# Patient Record
Sex: Male | Born: 1975 | Race: White | Hispanic: No | Marital: Married | Smoking: Never smoker
Health system: Southern US, Community
[De-identification: ages and names within clinical notes are randomized; demographics above are authoritative.]

## PROBLEM LIST (undated history)

## (undated) HISTORY — PX: COLONOSCOPY W/ POLYPECTOMY: SHX1380

---
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
## 2003-08-22 ENCOUNTER — Ambulatory Visit (HOSPITAL_COMMUNITY): Admission: RE | Admit: 2003-08-22 | Discharge: 2003-08-22 | Payer: Self-pay | Admitting: Family Medicine

## 2005-09-15 ENCOUNTER — Ambulatory Visit (HOSPITAL_COMMUNITY): Admission: RE | Admit: 2005-09-15 | Discharge: 2005-09-15 | Payer: Self-pay | Admitting: Internal Medicine

## 2013-06-30 ENCOUNTER — Other Ambulatory Visit (HOSPITAL_COMMUNITY): Payer: Self-pay | Admitting: Orthopaedic Surgery

## 2013-06-30 DIAGNOSIS — M25511 Pain in right shoulder: Secondary | ICD-10-CM

## 2013-07-05 ENCOUNTER — Encounter (HOSPITAL_COMMUNITY): Payer: Self-pay

## 2013-07-05 ENCOUNTER — Other Ambulatory Visit (HOSPITAL_COMMUNITY): Payer: Self-pay | Admitting: Orthopaedic Surgery

## 2013-07-05 ENCOUNTER — Ambulatory Visit (HOSPITAL_COMMUNITY)
Admission: RE | Admit: 2013-07-05 | Discharge: 2013-07-05 | Disposition: A | Discharge status: Home / Self Care | Payer: 59 | Source: Ambulatory Visit | Attending: Orthopaedic Surgery | Admitting: Orthopaedic Surgery

## 2013-07-05 DIAGNOSIS — S43439A Superior glenoid labrum lesion of unspecified shoulder, initial encounter: Secondary | ICD-10-CM | POA: Insufficient documentation

## 2013-07-05 DIAGNOSIS — M25511 Pain in right shoulder: Secondary | ICD-10-CM

## 2013-07-05 DIAGNOSIS — X58XXXA Exposure to other specified factors, initial encounter: Secondary | ICD-10-CM | POA: Insufficient documentation

## 2013-07-05 DIAGNOSIS — M25519 Pain in unspecified shoulder: Secondary | ICD-10-CM | POA: Insufficient documentation

## 2013-07-05 MED ORDER — LIDOCAINE HCL (PF) 1 % IJ SOLN
INTRAMUSCULAR | Status: AC
  Filled 2013-07-05: qty 5

## 2013-07-05 MED ORDER — IOHEXOL 300 MG/ML  SOLN
50.0000 mL | Freq: Once | INTRAMUSCULAR | Status: AC | PRN
  Administered 2013-07-05: 50 mL via INTRAVENOUS

## 2013-07-27 ENCOUNTER — Ambulatory Visit (HOSPITAL_COMMUNITY)
Admission: RE | Admit: 2013-07-27 | Discharge: 2013-07-27 | Disposition: A | Discharge status: Home / Self Care | Payer: 59 | Source: Ambulatory Visit | Attending: Orthopaedic Surgery | Admitting: Orthopaedic Surgery

## 2013-07-27 DIAGNOSIS — R29898 Other symptoms and signs involving the musculoskeletal system: Secondary | ICD-10-CM | POA: Insufficient documentation

## 2013-07-27 DIAGNOSIS — M25511 Pain in right shoulder: Secondary | ICD-10-CM

## 2013-07-27 DIAGNOSIS — M25519 Pain in unspecified shoulder: Secondary | ICD-10-CM | POA: Insufficient documentation

## 2013-07-27 DIAGNOSIS — IMO0001 Reserved for inherently not codable concepts without codable children: Secondary | ICD-10-CM | POA: Insufficient documentation

## 2013-07-27 DIAGNOSIS — M25619 Stiffness of unspecified shoulder, not elsewhere classified: Secondary | ICD-10-CM | POA: Insufficient documentation

## 2013-07-27 DIAGNOSIS — S43431A Superior glenoid labrum lesion of right shoulder, initial encounter: Secondary | ICD-10-CM | POA: Insufficient documentation

## 2013-07-27 NOTE — Evaluation (Signed)
Physical Therapy Evaluation  Patient Details  Name: Geoffrey Hardy MRN: [id number] Date of Birth: Nov 16, 1975  Today's Date: 07/27/2013 Time: 1535-1600 PT Time Calculation (min): 25 min Charges: 1evaluation             Visit#: 1 of 9  Re-eval: 08/17/13 Assessment Diagnosis: Rt shoulder  Next MD Visit: Dr. Ophelia Charter - Oct 30th  Prior Therapy: None  Subjective Symptoms/Limitations Symptoms: Pt is a 25 year Rt handed old male referred to PT for Rt shoulder pain which happened after demonstrating a head first slide into the base when demonstrating to his 39 year old daugthers softball team.  He reports that he did not have any pain initally, the next day he had significant pain to his RUE.  Unable to throw a ball.  MRI which revealed a laral tear and RTC tear.  Mostly concerned about the Labral tear.  Limitations: Lifting;House hold activities;Other (comment) (thorwing a softball, grooming) Patient Stated Goals: avoid surgery if possible.  Pain Assessment Currently in Pain?: Yes Pain Score: 8  (with 90/90 shoulder motion) Pain Location: Shoulder Pain Orientation: Right Pain Onset: 1 to 4 weeks ago Pain Frequency: Intermittent Effect of Pain on Daily Activities: unable to comfortably brush hair, difficulty with a coat.   Precautions/Restrictions     Balance Screening Balance Screen Has the patient fallen in the past 6 months: Yes How many times?: 1 Has the patient had a decrease in activity level because of a fear of falling? : Yes Is the patient reluctant to leave their home because of a fear of falling? 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, hunting and Scientist, forensic with leash training  Cognition/Observation    Sensation/Coordination/Flexibility/Functional Tests Functional Tests Functional Tests: FOTO: status: 61%; Limiation: 39%  Assessment RUE AROM  (degrees) Right Shoulder Flexion: 150 Degrees Right Shoulder ABduction: 110 Degrees Right Shoulder Internal Rotation: 90 Degrees Right Shoulder External Rotation: 60 Degrees RUE PROM (degrees) Right Shoulder Flexion: 160 Degrees Right Shoulder ABduction: 150 Degrees Right Shoulder Internal Rotation: 90 Degrees Right Shoulder External Rotation: 75 Degrees RUE Strength Right Shoulder Flexion: 3+/5 Right Shoulder Extension: 5/5 Right Shoulder ABduction: 3+/5 Right Shoulder Internal Rotation: 5/5 Right Shoulder External Rotation: 5/5 Right Shoulder Horizontal ABduction: 4/5 Right Shoulder Horizontal ADduction: 4/5 Palpation Palpation: pain and tenderness with moderate fascial restricitos to Rt UT and and deltoid tuberosity  Physical Therapy Assessment and Plan PT Assessment and Plan Clinical Impression Statement: Pt is a 37 year old male referred to PT for Rt shoulder SLAP lesion w/RTC tear with impairments listed below.  At this time pt is attempting therapy to decrease his need for surgery.  After evaluation (pt is 25 min late for) it is found that he is most limited by his lack of AROM shoulder abduction with ER due to SLAP  lesion and RTC tear.  with improved PROM.  Pt will benefit from skilled therapeutic intervention in order to improve on the following deficits: Pain;Decreased strength;Decreased range of motion;Impaired UE functional use Rehab Potential: Good PT Frequency: Min 3X/week PT Duration:  (3 weeks) PT Treatment/Interventions: Therapeutic activities;Therapeutic exercise;Balance training;Functional mobility training;Patient/family education;Manual techniques PT Plan: PROM for Rt shoulder motion in all direction, keep pain low.  Theraball activities and dowel rod for AAROM activities. progress to prone activties and therband activities to improve functional strength and motion.     Goals Home Exercise Program Pt/caregiver will Perform Home  Exercise Program:  Independently PT Goal: Perform Home Exercise Program - Progress: Goal set today PT Short Term Goals Time to Complete Short Term Goals: 3 weeks PT Short Term Goal 1: Pt will report pain with 90/90 shoulder motion less than a 4/10.  PT Short Term Goal 2: Pt will present improved Rt shoulder AROM: flexion: 165, scaption/abduction: 150, ER: 85 for improved RUE function with grooming.  PT Short Term Goal 3: Pt will improve his RUE shoulder strength at end range of motion in order to return to throwing a softball for 50 feet to return to participating with his daughters athletics.   Problem List Patient Active Problem List   Diagnosis Date Noted  . Right shoulder pain 07/27/2013  . 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 Right arm weakness 07/27/2013    PT Plan of Care PT Home Exercise Plan: given PT Patient Instructions: importance of HEP and strengthenig.   GP    Ysenia Filice, MPT, ATC 07/27/2013, 5:09 PM  Physician Documentation Your signature is required to indicate approval of the treatment plan as stated above.  Please sign and either send electronically or make a copy of this report for your files and return this physician signed original.   Please mark one 1.__approve of plan  2. ___approve of plan with the following conditions.   ______________________________                                                          _____________________ Physician Signature                                                                                                             Date

## 2013-08-08 ENCOUNTER — Ambulatory Visit (HOSPITAL_COMMUNITY): Payer: 59 | Admitting: Physical Therapy

## 2013-08-10 ENCOUNTER — Ambulatory Visit (HOSPITAL_COMMUNITY)
Admission: RE | Admit: 2013-08-10 | Discharge: 2013-08-10 | Disposition: A | Discharge status: Home / Self Care | Payer: 59 | Source: Ambulatory Visit | Attending: Internal Medicine | Admitting: Internal Medicine

## 2013-08-10 NOTE — Progress Notes (Addendum)
Physical Therapy Treatment Patient Details  Name: Geoffrey Hardy MRN: [id number] Date of Birth: 1976/04/22  Today's Date: 08/10/2013 Time: 1522-1610 PT Time Calculation (min): 48 min Visit#: 2 of 9  Re-eval: 08/17/13 Charges:  therex 35' ice 10''  Subjective: Pt states he was sore after last visit and had difficulty sleeping that night.  Currently without pain unless he places Rt UE into end ROM.  Exercise/Treatments Supine Flexion: 10 reps;AAROM;Other (comment);Limitations Flexion Limitations: w/2# dowel Other Supine Exercises: PROM for flexion, ER/IR Seated Other Seated Exercises: Pulleys 2' flexion/abduction Other Seated Exercises: physioball flexion, abduction, CW, CCW 10 reps each Sidelying External Rotation: 10 reps ABduction: 10 reps Other Sidelying Exercises: PROM for abduction  Modalities Modalities: Cryotherapy Cryotherapy Number Minutes Cryotherapy: 10 Minutes Cryotherapy Location: Shoulder Type of Cryotherapy: Ice pack  Physical Therapy Assessment and Plan PT Assessment and Plan PT Assessment:  AAROM exercises added using pulleys, physioball and dowel.  PROM performed in all planes with abd being most limited with painful end range into flexion.  Gentle MFR techniques performed f/b ice at conclusion to help decrease pain and prevent swelling.  PT Plan: PROM for Rt shoulder motion in all direction, keep pain low. Progress to prone activties and therband activities to improve functional strength and motion when ROM WNL and without pain.      Problem List Patient Active Problem List   Diagnosis Date Noted  . Right shoulder pain 07/27/2013  . 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 Right arm weakness 07/27/2013      Lurena Nida, PTA/CLT 08/10/2013, 6:02 PM

## 2013-08-12 ENCOUNTER — Ambulatory Visit (HOSPITAL_COMMUNITY)
Admission: RE | Admit: 2013-08-12 | Discharge: 2013-08-12 | Disposition: A | Discharge status: Home / Self Care | Payer: 59 | Source: Ambulatory Visit | Attending: Internal Medicine | Admitting: Internal Medicine

## 2013-08-12 DIAGNOSIS — S43431A Superior glenoid labrum lesion of right shoulder, initial encounter: Secondary | ICD-10-CM

## 2013-08-12 DIAGNOSIS — R29898 Other symptoms and signs involving the musculoskeletal system: Secondary | ICD-10-CM

## 2013-08-12 DIAGNOSIS — M25511 Pain in right shoulder: Secondary | ICD-10-CM

## 2013-08-12 NOTE — Progress Notes (Signed)
Physical Therapy Treatment Patient Details  Name: Geoffrey Hardy MRN: [id number] Date of Birth: Apr 12, 1976  Today's Date: 08/12/2013 Time: 1520-1605 PT Time Calculation (min): 45 min Charge:TE 1520-1545, Manual 1191-4782  Visit#: 3 of 9  Re-eval: 08/17/13 Assessment Diagnosis: Rt shoulder  Next MD Visit: Dr. Ophelia Charter - Oct 30th  Prior Therapy: None  Subjective: Symptoms/Limitations Symptoms: Pt reported he has had increased pain going certain directions since last session, currently pain free.   Pain Assessment Currently in Pain?: No/denies  Objective:  Exercise/Treatments Seated Other Seated Exercises: Pulleys 2' flexion/abduction Other Seated Exercises: physioball flexion, abduction, CW, CCW 10 reps each   Stretches Wall Stretch - Flexion: 5 reps Wall Stretch - ABduction: 5 reps  Manual Therapy Manual Therapy: Myofascial release Joint Mobilization: PROM 3x 30" all directions Myofascial Release: MFR to reduce fascial restrictions anterior and lateral Rt shoulder.  Physical Therapy Assessment and Plan PT Assessment and Plan Clinical Impression Statement: Added wall walks to improve AROM with flexion and abduction and manual techniques to reduce fascial restrictions, improve ROM and control pain.  Pt stated minimal  pain increased to 1-2/10.  Encouraged pt to apply ice for pain and edema control.   PT Plan: PROM for Rt shoulder motion in all direction, keep pain low.  Theraball activities and dowel rod for AAROM activities. progress to prone activties and therband activities to improve functional strength and motion.     Goals Home Exercise Program Pt/caregiver will Perform Home Exercise Program: Independently PT Short Term Goals Time to Complete Short Term Goals: 3 weeks PT Short Term Goal 1: Pt will report pain with 90/90 shoulder motion less than a 4/10.  PT Short Term Goal 1 - Progress: Progressing toward goal PT Short Term Goal 2: Pt will present improved Rt shoulder  AROM: flexion: 165, scaption/abduction: 150, ER: 85 for improved RUE function with grooming.  PT Short Term Goal 2 - Progress: Progressing toward goal PT Short Term Goal 3: Pt will improve his RUE shoulder strength at end range of motion in order to return to throwing a softball for 50 feet to return to participating with his daughters athletics.  PT Short Term Goal 3 - Progress: Progressing toward goal  Problem List Patient Active Problem List   Diagnosis Date Noted  . Right shoulder pain 07/27/2013  . 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 Right arm weakness 07/27/2013    PT - End of Session Activity Tolerance: Patient tolerated treatment well General Behavior During Therapy: [facility] for tasks assessed/performed  GP    Juel Burrow 08/12/2013, 4:57 PM

## 2013-08-15 ENCOUNTER — Ambulatory Visit (HOSPITAL_COMMUNITY)
Admission: RE | Admit: 2013-08-15 | Discharge: 2013-08-15 | Disposition: A | Discharge status: Home / Self Care | Payer: 59 | Source: Ambulatory Visit | Attending: Internal Medicine | Admitting: Internal Medicine

## 2013-08-15 DIAGNOSIS — S43431A Superior glenoid labrum lesion of right shoulder, initial encounter: Secondary | ICD-10-CM

## 2013-08-15 DIAGNOSIS — M25511 Pain in right shoulder: Secondary | ICD-10-CM

## 2013-08-15 DIAGNOSIS — R29898 Other symptoms and signs involving the musculoskeletal system: Secondary | ICD-10-CM

## 2013-08-15 NOTE — Progress Notes (Signed)
Physical Therapy Treatment Patient Details  Name: Geoffrey Hardy MRN: [id number] Date of Birth: 1975-12-28  Today's Date: 08/15/2013 Time: 4098-1191 PT Time Calculation (min): 43 min Charge: TE 1515-1540, Manual 1540-1558  Visit#: 4 of 9  Re-eval: 08/17/13 Assessment Diagnosis: Rt shoulder  Next MD Visit: Dr. Ophelia Charter - Oct 30th  Prior Therapy: None  Subjective: Symptoms/Limitations Symptoms: Pt stated no pain unless move arm certain directions. Pain Assessment Currently in Pain?: No/denies  Objective:  Exercise/Treatments Seated Other Seated Exercises: Pulleys 2' flexion/abduction Other Seated Exercises: physioball flexion, abduction, CW, CCW 10 reps each Prone  Extension: 10 reps External Rotation: 10 reps Other Prone Exercises: prone rows Sidelying External Rotation: 10 reps ABduction: 10 reps Stretches Wall Stretch - Flexion: 5 reps Wall Stretch - ABduction: 5 reps  Manual Therapy Manual Therapy: Myofascial release Joint Mobilization: PROM 3x 30" all directions in supine position wtih LE elevated Myofascial Release: MFR to reduce fascial restrictions anterior and lateral Rt shoulder  Physical Therapy Assessment and Plan PT Assessment and Plan Clinical Impression Statement: Progressed to prone exercises for shoulder and began postural strengthening this session.  Pt able to demonstrate appropriate technique with all exercises following cueing.  Pt reported pain increased to 4-5/10 with PROM abduction, manual techniques complete to reduce fascial restrictions, pt stated pain free at end of session.  Encouraged pt to apply ice for pain control.   PT Plan: Re-eval next session prior MD apt on 10/30.  Continue with PROM for Rt shoulder motion in all direction, keep pain low. Progress to therband activities to improve functional strength and motion.     Goals Home Exercise Program Pt/caregiver will Perform Home Exercise Program: Independently PT Short Term Goals Time  to Complete Short Term Goals: 3 weeks PT Short Term Goal 1: Pt will report pain with 90/90 shoulder motion less than a 4/10.  PT Short Term Goal 1 - Progress: Progressing toward goal PT Short Term Goal 2: Pt will present improved Rt shoulder AROM: flexion: 165, scaption/abduction: 150, ER: 85 for improved RUE function with grooming.  PT Short Term Goal 2 - Progress: Progressing toward goal PT Short Term Goal 3: Pt will improve his RUE shoulder strength at end range of motion in order to return to throwing a softball for 50 feet to return to participating with his daughters athletics.  PT Short Term Goal 3 - Progress: Progressing toward goal  Problem List Patient Active Problem List   Diagnosis Date Noted  . Right shoulder pain 07/27/2013  . 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 Right arm weakness 07/27/2013    PT - End of Session Activity Tolerance: Patient tolerated treatment well General Behavior During Therapy: [facility] for tasks assessed/performed  GP    Juel Burrow 08/15/2013, 4:06 PM

## 2013-08-17 ENCOUNTER — Ambulatory Visit (HOSPITAL_COMMUNITY): Payer: 59

## 2015-03-04 IMAGING — RF DG FLUORO GUIDE NDL PLC/BX
4 series · 4 of 4 positions shown · IV contrast (multihance)
Comparison: none

CLINICAL DATA: Right shoulder pain, injury.

EXAM:
Right SHOULDER INJECTION UNDER FLUOROSCOPY
TECHNIQUE: An appropriate skin entrance site was determined. The site was
marked, prepped with Betadine, draped in the usual sterile fashion,
and infiltrated locally with buffered Lidocaine. 22 gauge spinal
needle was advanced to the superomedial margin of the humeral head
under intermittent fluoroscopy. 1 ml of Lidocaine injected easily. A
mixture of 0.05 ml Multihance 10 ml of Omnipaque 300 was then used
to opacify the right shoulder capsule. No immediate complication.
FLUOROSCOPY TIME:  48 seconds

[Series 1: run · 1 of 1 slices shown (1 of 4)]
[im 1/1]
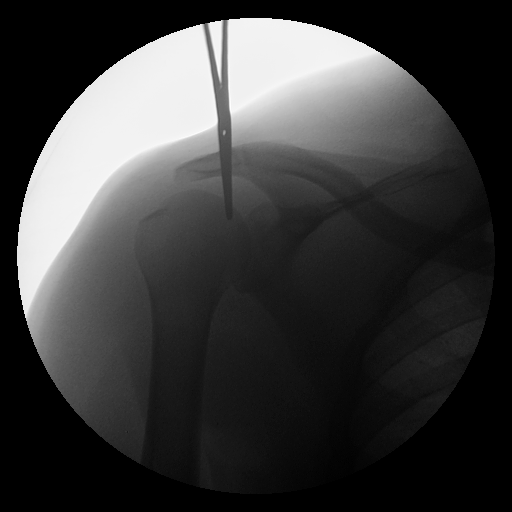

[Series 2: run · 1 of 1 slices shown (2 of 4)]
[im 1/1]
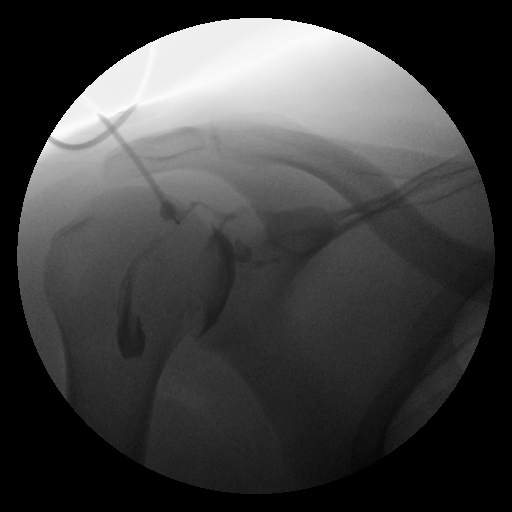

[Series 3: run · 1 of 1 slices shown (3 of 4)]
[im 1/1]
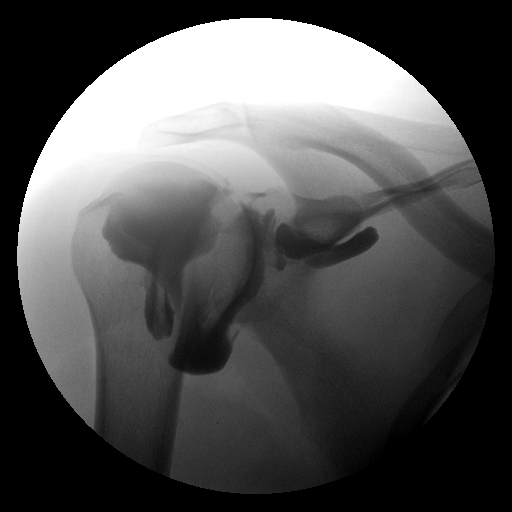

[Series 4: run · 1 of 1 slices shown (4 of 4)]
[im 1/1]
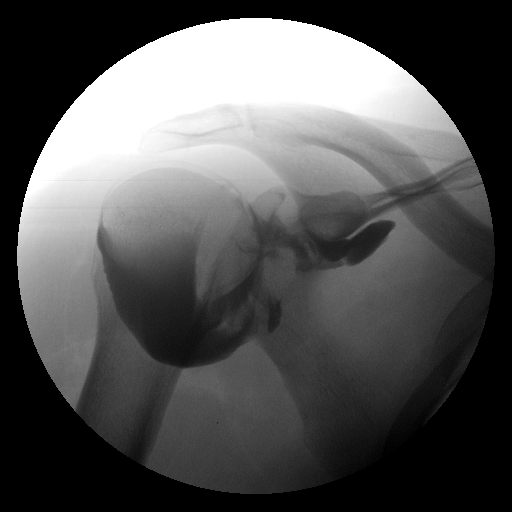

[4 of 4 positions shown; findings below may reference images not displayed]

FINDINGS: Spot images demonstrate no visible passage of contrast into the
subacromial space to suggest rotator cuff tear. Patient will be
taken to MRI for MR arthrogram.
IMPRESSION: Technically successful right shoulder injection for MRI.

## 2019-08-18 ENCOUNTER — Ambulatory Visit: Payer: Self-pay

## 2019-08-18 ENCOUNTER — Other Ambulatory Visit: Payer: Self-pay | Admitting: *Deleted

## 2019-08-18 DIAGNOSIS — Z20822 Contact with and (suspected) exposure to covid-19: Secondary | ICD-10-CM

## 2019-08-19 ENCOUNTER — Telehealth: Payer: Self-pay

## 2019-08-19 NOTE — Telephone Encounter (Signed)
Patient called for COVID-19 test results. He was told his result was still pending. He will call tomorrow.

## 2019-08-19 NOTE — Telephone Encounter (Signed)
Patient called and he was informed that his COVID-19 test had not resulted

## 2019-08-20 LAB — NOVEL CORONAVIRUS, NAA: SARS-CoV-2, NAA: NOT DETECTED

## 2019-08-29 NOTE — Telephone Encounter (Signed)
Pt.  inquiring about test results. Not available yet.

## 2021-08-21 ENCOUNTER — Ambulatory Visit (INDEPENDENT_AMBULATORY_CARE_PROVIDER_SITE_OTHER): Payer: BLUE CROSS/BLUE SHIELD | Admitting: Dermatology

## 2021-08-21 ENCOUNTER — Encounter: Payer: Self-pay | Admitting: Dermatology

## 2021-08-21 ENCOUNTER — Other Ambulatory Visit: Payer: Self-pay

## 2021-08-21 DIAGNOSIS — W57XXXD Bitten or stung by nonvenomous insect and other nonvenomous arthropods, subsequent encounter: Secondary | ICD-10-CM

## 2021-08-21 DIAGNOSIS — R21 Rash and other nonspecific skin eruption: Secondary | ICD-10-CM | POA: Diagnosis not present

## 2021-08-21 DIAGNOSIS — L918 Other hypertrophic disorders of the skin: Secondary | ICD-10-CM

## 2021-08-21 DIAGNOSIS — S40262D Insect bite (nonvenomous) of left shoulder, subsequent encounter: Secondary | ICD-10-CM | POA: Diagnosis not present

## 2021-08-21 DIAGNOSIS — Z1283 Encounter for screening for malignant neoplasm of skin: Secondary | ICD-10-CM

## 2021-09-08 ENCOUNTER — Encounter: Payer: Self-pay | Admitting: Dermatology

## 2021-09-08 NOTE — Addendum Note (Signed)
Addended by: Janalyn Harder on: 09/08/2021 06:50 PM   Modules accepted: Level of Service

## 2021-09-08 NOTE — Progress Notes (Signed)
   Follow-Up Visit   Subjective  Geoffrey Hardy is a 45 y.o. male who presents for the following: Annual Exam (Tags under arms patient aware not covered, and seb derm facial treatment ketoconazole and hydrocortisone worked in the past ).  Recheck rash (seborrheic dermatitis) and patient requests skin tags be removed  Location:  Duration:  Quality:  Associated Signs/Symptoms: Modifying Factors:  Severity:  Timing: Context:   Objective  Well appearing patient in no apparent distress; mood and affect are within normal limits. Chest - Medial [facility]) Full body skin check, Chest rash compatible with seborrheic dermatitis.  Left Upper Back Wife noticed just days ago: Edematous four mm pink papule  Left Axilla, Neck - Anterior, Right Axilla Neck line and axillae pedunculated 1 to 4 mm papules    All skin waist up examined.   Assessment & Plan    Rash and other nonspecific skin eruption Chest - Medial [facility])  He will initially try over the counter hydrocortisone cream; if this should fail, we will prescribe  his previous medications.   Tick bite of left shoulder, subsequent encounter Left Upper Back  No tick present; warning signs of tick born Disease reviewed ; he can contact me with any new onset symptoms.  Skin tags, multiple acquired (3) Neck - Anterior; Left Axilla; Right Axilla  Selected tags scissor excised at patient's request      I, Janalyn Harder, MD, have reviewed all documentation for this visit.  The documentation on 09/08/21 for the exam, diagnosis, procedures, and orders are all accurate and complete.

## 2021-10-03 ENCOUNTER — Telehealth: Payer: Self-pay | Admitting: Dermatology

## 2021-10-03 NOTE — Telephone Encounter (Signed)
Wife called, says EOB for 08/21/21 shows a charge for something that wasn't done: surgery $350. She says there was no removal or burning or anything. Says it was just an office visit. Chart shows 3 tags removed BUT later spoke to husband who said when he asked what it would cost you were uncertain + he said "No way I'm getting anything done if I don't know what it will cost" , so it wasn't done.

## 2021-10-07 NOTE — Telephone Encounter (Signed)
Phone call to patient with Dr. Sherryl Barters message. Patient aware.

## 2022-01-01 ENCOUNTER — Telehealth: Payer: Self-pay | Admitting: Dermatology

## 2022-01-01 NOTE — Telephone Encounter (Signed)
Lm apologizing to the patient that the charges weren't removed when they called back in December. After reviewing previous telephone encounters, Dr Jorja Loa said he didn't remove the tags but forgot to remove the charges in his chart. I told the patient I'll keep an eye out to make sure the skin tag charge will be removed this time, and to call if he has anymore questions.  ?

## 2022-01-01 NOTE — Telephone Encounter (Signed)
On October 07, 2021, I added a billing note to VOID the skin tags removal charge 45809 and made the status of my billing note as "high priority." Then over a week after, someone from the billing department saw my note and on 10/30/21 they voided the 11200 charge code and resubmitted it as corrected claim.  ?

## 2022-01-01 NOTE — Telephone Encounter (Signed)
Wife says they keep getting bills for something ST didn't do (tags) and she spoke to a woman here who told her she doesn't owe anything. She said [facility] billing is now threatening to turn her over to collections. She also said we had checked and found that there hadn't been tags removed and ST was apologetic.  She's more than a little upset about the possibility so please see what's going on. ?

## 2022-08-27 ENCOUNTER — Ambulatory Visit: Payer: BLUE CROSS/BLUE SHIELD | Admitting: Dermatology

## 2024-04-04 ENCOUNTER — Ambulatory Visit: Admitting: Urology

## 2024-04-18 ENCOUNTER — Ambulatory Visit (INDEPENDENT_AMBULATORY_CARE_PROVIDER_SITE_OTHER): Admitting: Urology

## 2024-04-18 ENCOUNTER — Encounter: Payer: Self-pay | Admitting: Urology

## 2024-04-18 DIAGNOSIS — N433 Hydrocele, unspecified: Secondary | ICD-10-CM

## 2024-04-18 NOTE — Progress Notes (Signed)
 04/18/2024 10:15 AM   Geoffrey Hardy 11-09-1975 [id number]  Referring provider: Vida Mardy DEL, PA-C [address] [place],  [place] [address]  No chief complaint on file.   HPI: New pt -   1) right hydrocele - noted tick bite May 2025 in the groin/testicle area.  He noticed swelling of the right hemiscrotum.  March 2025 creatinine 1.14.  UA clear.  I did not see a PSA.  A 02/24/2024 scrotal ultrasound report from [place]  reported a large hydrocele seen on the right side.  Testicles reported as no mass. No prior inguinal or scrotal surgery. IPSS 0.   Today, [name] is seen for the above.   He works for [employer] in [place].    PMH: No past medical history on file.  Surgical History: No past surgical history on file.  Home Medications:  Allergies as of 04/18/2024       Reactions   Erythromycin    UPSET STOMACH    Penicillins    UPSET STOMACH         Medication List        Accurate as of April 18, 2024 10:15 AM. If you have any questions, ask your nurse or doctor.          levocetirizine 5 MG tablet Commonly known as: XYZAL Take 5 mg by mouth daily.   levothyroxine 100 MCG tablet Commonly known as: SYNTHROID Take 100 mcg by mouth daily before breakfast.   montelukast 10 MG tablet Commonly known as: SINGULAIR Take 10 mg by mouth daily.        Allergies:  Allergies  Allergen Reactions   Erythromycin     UPSET STOMACH    Penicillins     UPSET STOMACH     Family History: No family history on file.  Social History:  reports that he has never smoked. He has never used smokeless tobacco. He reports current alcohol use. He reports that he does not use drugs.   Physical Exam: There were no vitals taken for this visit.  Constitutional:  Alert and oriented, No acute distress. HEENT: [place] AT, moist mucus membranes.  Trachea midline, no masses. Cardiovascular: No clubbing, cyanosis, or edema. Respiratory: Normal respiratory effort, no  increased work of breathing. GI: Abdomen is soft, nontender, nondistended, no abdominal masses GU: No CVA tenderness Lymph: No cervical or inguinal lymphadenopathy. Skin: No rashes, bruises or suspicious lesions. Neurologic: Grossly intact, no focal deficits, moving all 4 extremities. Psychiatric: Normal mood and affect. GU: Penis circumcised, normal foreskin, testicles descended bilaterally and palpably normal, left epididymis palpably normal, 5+ cm right hydrocele, scrotum normal  Laboratory Data: No results found for: WBC, HGB, HCT, MCV, PLT  No results found for: CREATININE  No results found for: PSA  No results found for: TESTOSTERONE  No results found for: HGBA1C  Urinalysis No results found for: COLORURINE, APPEARANCEUR, LABSPEC, PHURINE, GLUCOSEU, HGBUR, BILIRUBINUR, KETONESUR, PROTEINUR, UROBILINOGEN, NITRITE, LEUKOCYTESUR  No results found for: LABMICR, WBCUA, RBCUA, LABEPIT, MUCUS, BACTERIA  Pertinent Imaging: Scrot us  report    Assessment & Plan:    Right hydrocele - I discussed with the patient the nature, potential benefits, risks and alternatives to right hydrocelectomy, including side effects of the proposed treatment, the likelihood of the patient achieving the goals of the procedure, and any potential problems that might occur during the procedure or recuperation and recurrences among others. All questions answered. Patient elects to proceed. Discussed will try and set up for me to do  in [place] or may need to get Dr. Evia to do it here at [facility].     No follow-ups on file.  Donnice Brooks, MD  [facility]  [address] [place], [place] [address] [phone]

## 2024-04-21 ENCOUNTER — Other Ambulatory Visit: Payer: Self-pay | Admitting: Urology

## 2024-06-30 NOTE — Patient Instructions (Signed)
 SURGICAL WAITING ROOM VISITATION Patients having surgery or a procedure may have no more than 2 support people in the waiting area - these visitors may rotate in the visitor waiting room.   If the patient needs to stay at the hospital during part of their recovery, the visitor guidelines for inpatient rooms apply.  PRE-OP VISITATION  Pre-op nurse will coordinate an appropriate time for 1 support person to accompany the patient in pre-op.  This support person may not rotate.  This visitor will be contacted when the time is appropriate for the visitor to come back in the pre-op area.  Please refer to the [facility] website for the visitor guidelines for Inpatients (after your surgery is over and you are in a regular room).  You are not required to quarantine at this time prior to your surgery. However, you must do this: Hand Hygiene often Do NOT share personal items Notify your provider if you are in close contact with someone who has COVID or you develop fever 100.4 or greater, new onset of sneezing, cough, sore throat, shortness of breath or body aches.  If you test positive for Covid or have been in contact with anyone that has tested positive in the last 10 days please notify you surgeon.    Your procedure is scheduled on:  Tuesday  July 12, 2024  Report to [facility] Main Entrance: [address] entrance where the [employer] is available.   Report to admitting at:  05:15   AM  Call this number if you have any questions or problems the morning of surgery [phone]  DO NOT EAT OR DRINK ANYTHING AFTER MIDNIGHT THE NIGHT PRIOR TO YOUR SURGERY / PROCEDURE.   FOLLOW  ANY ADDITIONAL PRE OP INSTRUCTIONS YOU RECEIVED FROM YOUR SURGEON'S OFFICE!!!   Oral Hygiene is also important to reduce your risk of infection.        Remember - BRUSH YOUR TEETH THE MORNING OF SURGERY WITH YOUR REGULAR TOOTHPASTE  Do NOT smoke after Midnight the night before surgery.  STOP TAKING all  Vitamins, Herbs and supplements 1 week before your surgery.   Take ONLY these medicines the morning of surgery with A SIP OF WATER: Levocetirizine.                     You may not have any metal on your body including  jewelry, and body piercing  Do not wear lotions, powders, cologne, or deodorant  Men may shave face and neck.  Contacts, Hearing Aids, dentures or bridgework may not be worn into surgery. DENTURES WILL BE REMOVED PRIOR TO SURGERY PLEASE DO NOT APPLY Poly grip OR ADHESIVES!!!  Patients discharged on the day of surgery will not be allowed to drive home.  Someone NEEDS to stay with you for the first 24 hours after anesthesia.  Do not bring your home medications to the hospital. The Pharmacy will dispense medications listed on your medication list to you during your admission in the Hospital.  Please read over the following fact sheets you were given: IF YOU HAVE QUESTIONS ABOUT YOUR PRE-OP INSTRUCTIONS, PLEASE CALL [phone].   [facility] - Preparing for Surgery Before surgery, you can play an important role.  Because skin is not sterile, your skin needs to be as free of germs as possible.  You can reduce the number of germs on your skin by washing with CHG (chlorahexidine gluconate) soap before surgery.  CHG is an antiseptic cleaner which kills germs  and bonds with the skin to continue killing germs even after washing. Please DO NOT use if you have an allergy to CHG or antibacterial soaps.  If your skin becomes reddened/irritated stop using the CHG and inform your nurse when you arrive at Short Stay. Do not shave (including legs and underarms) for at least 48 hours prior to the first CHG shower.  You may shave your face/neck.  Please follow these instructions carefully:  1.  Shower with CHG Soap the night before surgery and the  morning of surgery.  2.  If you choose to wash your hair, wash your hair first as usual with your normal  shampoo.  3.  After you shampoo,  rinse your hair and body thoroughly to remove the shampoo.                             4.  Use CHG as you would any other liquid soap.  You can apply chg directly to the skin and wash.  Gently with a scrungie or clean washcloth.  5.  Apply the CHG Soap to your body ONLY FROM THE NECK DOWN.   Do not use on face/ open                           Wound or open sores. Avoid contact with eyes, ears mouth and genitals (private parts).                       Wash face,  Genitals (private parts) with your normal soap.             6.  Wash thoroughly, paying special attention to the area where your  surgery  will be performed.  7.  Thoroughly rinse your body with warm water from the neck down.  8.  DO NOT shower/wash with your normal soap after using and rinsing off the CHG Soap.            9.  Pat yourself dry with a clean towel.            10.  Wear clean pajamas.            11.  Place clean sheets on your bed the night of your first shower and do not  sleep with pets.  ON THE DAY OF SURGERY : Do not apply any lotions/deodorants the morning of surgery.  Please wear clean clothes to the hospital/surgery center.    FAILURE TO FOLLOW THESE INSTRUCTIONS MAY RESULT IN THE CANCELLATION OF YOUR SURGERY  PATIENT SIGNATURE_________________________________  NURSE SIGNATURE__________________________________  ________________________________________________________________________

## 2024-06-30 NOTE — Progress Notes (Signed)
 COVID Vaccine received:  [x]  No []  Yes Date of any COVID positive Test in last 90 days: none  PCP - Gaither Langton, MD [phone] (Work)  [phone] (Fax)  Cardiologist - None  Chest x-ray -  EKG -  07-01-2024  Epic Stress Test -  ECHO -  Cardiac Cath -  CT Coronary Calcium score:   Bowel Prep - [x]  No  []   Yes ______  Pacemaker / ICD device [x]  No []  Yes   Spinal Cord Stimulator:[x]  No []  Yes       History of Sleep Apnea? [x]  No []  Yes   CPAP used?- [x]  No []  Yes    Patient has: [x]  NO Hx DM   []  Pre-DM   []  DM1  []   DM2 Does the patient monitor blood sugar?   [x]  N/A   []  No []  Yes   Blood Thinner / Instructions: None Aspirin Instructions: none  ERAS Protocol Ordered: [x]  No  []  Yes Patient is to be NPO after: Midnight Prior  Dental hx: []  Dentures:  [x]  N/A      []  Bridge or Partial:                   []  Loose or Damaged teeth:   Activity level: Able to walk up 2 flights of stairs without becoming significantly short of breath or having chest pain?  []  No   [x]    Yes  Patient can perform ADLs without assistance. []  No   [x]   Yes  Anesthesia review: HTN- no meds  Patient denies any S&S of respiratory illness or Covid - no shortness of breath, fever, cough or chest pain at PAT appointment.  Patient verbalized understanding and agreement to the Pre-Surgical Instructions that were given to them at this PAT appointment. Patient was also educated of the need to review these PAT instructions again prior to his surgery.I reviewed the appropriate phone numbers to call if they have any and questions or concerns.

## 2024-07-01 ENCOUNTER — Encounter (HOSPITAL_COMMUNITY)
Admission: RE | Admit: 2024-07-01 | Discharge: 2024-07-01 | Disposition: A | Discharge status: Home / Self Care | Source: Ambulatory Visit | Attending: Urology | Admitting: Urology

## 2024-07-01 ENCOUNTER — Encounter (HOSPITAL_COMMUNITY): Payer: Self-pay

## 2024-07-01 ENCOUNTER — Other Ambulatory Visit: Payer: Self-pay

## 2024-07-01 VITALS — BP 122/86 | HR 68 | Temp 98.3°F | Resp 14 | Ht 72.0 in | Wt 221.0 lb

## 2024-07-01 DIAGNOSIS — I10 Essential (primary) hypertension: Secondary | ICD-10-CM | POA: Insufficient documentation

## 2024-07-01 DIAGNOSIS — N433 Hydrocele, unspecified: Secondary | ICD-10-CM | POA: Insufficient documentation

## 2024-07-01 DIAGNOSIS — Z01818 Encounter for other preprocedural examination: Secondary | ICD-10-CM | POA: Insufficient documentation

## 2024-07-01 LAB — CBC
HCT: 43.1 % (ref 39.0–52.0)
Hemoglobin: 15.1 g/dL (ref 13.0–17.0)
MCH: 29.9 pg (ref 26.0–34.0)
MCHC: 35 g/dL (ref 30.0–36.0)
MCV: 85.3 fL (ref 80.0–100.0)
Platelets: 324 K/uL (ref 150–400)
RBC: 5.05 MIL/uL (ref 4.22–5.81)
RDW: 13.2 % (ref 11.5–15.5)
WBC: 9.3 K/uL (ref 4.0–10.5)
nRBC: 0 % (ref 0.0–0.2)

## 2024-07-01 LAB — BASIC METABOLIC PANEL WITH GFR
Anion gap: 12 (ref 5–15)
BUN: 14 mg/dL (ref 6–20)
CO2: 21 mmol/L — ABNORMAL LOW (ref 22–32)
Calcium: 9.4 mg/dL (ref 8.9–10.3)
Chloride: 105 mmol/L (ref 98–111)
Creatinine, Ser: 1.03 mg/dL (ref 0.61–1.24)
GFR, Estimated: >60 mL/min (ref >60)
Glucose, Bld: 90 mg/dL (ref 70–99)
Potassium: 4.2 mmol/L (ref 3.5–5.1)
Sodium: 139 mmol/L (ref 135–145)

## 2024-07-12 ENCOUNTER — Ambulatory Visit (HOSPITAL_COMMUNITY)
Admission: RE | Admit: 2024-07-12 | Discharge: 2024-07-12 | Disposition: A | Discharge status: Home / Self Care | Attending: Urology | Admitting: Urology

## 2024-07-12 ENCOUNTER — Other Ambulatory Visit: Payer: Self-pay

## 2024-07-12 ENCOUNTER — Encounter (HOSPITAL_COMMUNITY)
Admission: RE | Disposition: A | Discharge status: Home / Self Care | Payer: Self-pay | Source: Home / Self Care | Attending: Urology

## 2024-07-12 ENCOUNTER — Encounter (HOSPITAL_COMMUNITY): Payer: Self-pay | Admitting: Urology

## 2024-07-12 ENCOUNTER — Ambulatory Visit (HOSPITAL_COMMUNITY): Payer: Self-pay | Admitting: Physician Assistant

## 2024-07-12 ENCOUNTER — Ambulatory Visit (HOSPITAL_COMMUNITY): Admitting: Certified Registered"

## 2024-07-12 DIAGNOSIS — N433 Hydrocele, unspecified: Secondary | ICD-10-CM | POA: Insufficient documentation

## 2024-07-12 DIAGNOSIS — I10 Essential (primary) hypertension: Secondary | ICD-10-CM

## 2024-07-12 DIAGNOSIS — Z01818 Encounter for other preprocedural examination: Secondary | ICD-10-CM

## 2024-07-12 HISTORY — PX: HYDROCELE EXCISION: SHX482

## 2024-07-12 SURGERY — HYDROCELECTOMY
Anesthesia: General | Laterality: Right

## 2024-07-12 MED ORDER — OXYCODONE HCL 5 MG PO TABS: 5.0000 mg | ORAL_TABLET | Freq: Once | ORAL | Status: DC | PRN

## 2024-07-12 MED ORDER — ORAL CARE MOUTH RINSE: 15.0000 mL | Freq: Once | OROMUCOSAL | Status: AC

## 2024-07-12 MED ORDER — FENTANYL CITRATE (PF) 100 MCG/2ML IJ SOLN
INTRAMUSCULAR | Status: AC
  Filled 2024-07-12: qty 2

## 2024-07-12 MED ORDER — KETOROLAC TROMETHAMINE 30 MG/ML IJ SOLN
INTRAMUSCULAR | Status: DC | PRN
Start: 2024-07-12 — End: 2024-07-12
  Administered 2024-07-12: 30 mg via INTRAVENOUS

## 2024-07-12 MED ORDER — LIDOCAINE HCL (PF) 2 % IJ SOLN
INTRAMUSCULAR | Status: AC
  Filled 2024-07-12: qty 5

## 2024-07-12 MED ORDER — ONDANSETRON HCL 4 MG/2ML IJ SOLN
INTRAMUSCULAR | Status: AC
  Filled 2024-07-12: qty 2

## 2024-07-12 MED ORDER — CHLORHEXIDINE GLUCONATE 0.12 % MT SOLN
15.0000 mL | Freq: Once | OROMUCOSAL | Status: AC
  Administered 2024-07-12: 15 mL via OROMUCOSAL

## 2024-07-12 MED ORDER — BUPIVACAINE HCL (PF) 0.25 % IJ SOLN
INTRAMUSCULAR | Status: AC
  Filled 2024-07-12: qty 30

## 2024-07-12 MED ORDER — 0.9 % SODIUM CHLORIDE (POUR BTL) OPTIME
TOPICAL | Status: DC | PRN
  Administered 2024-07-12: 1000 mL

## 2024-07-12 MED ORDER — OXYCODONE HCL 5 MG/5ML PO SOLN: 5.0000 mg | Freq: Once | ORAL | Status: DC | PRN

## 2024-07-12 MED ORDER — ACETAMINOPHEN 10 MG/ML IV SOLN: 1000.0000 mg | Freq: Once | INTRAVENOUS | Status: DC | PRN

## 2024-07-12 MED ORDER — BUPIVACAINE HCL (PF) 0.25 % IJ SOLN
INTRAMUSCULAR | Status: DC | PRN
  Administered 2024-07-12: 9 mL

## 2024-07-12 MED ORDER — KETOROLAC TROMETHAMINE 30 MG/ML IJ SOLN
INTRAMUSCULAR | Status: AC
  Filled 2024-07-12: qty 1

## 2024-07-12 MED ORDER — DROPERIDOL 2.5 MG/ML IJ SOLN: 0.6250 mg | Freq: Once | INTRAMUSCULAR | Status: DC | PRN

## 2024-07-12 MED ORDER — DEXAMETHASONE SODIUM PHOSPHATE 10 MG/ML IJ SOLN
INTRAMUSCULAR | Status: AC
  Filled 2024-07-12: qty 1

## 2024-07-12 MED ORDER — ACETAMINOPHEN 500 MG PO TABS: 1000.0000 mg | ORAL_TABLET | Freq: Once | ORAL | Status: DC

## 2024-07-12 MED ORDER — MIDAZOLAM HCL 2 MG/2ML IJ SOLN
INTRAMUSCULAR | Status: AC
  Filled 2024-07-12: qty 2

## 2024-07-12 MED ORDER — CEFAZOLIN SODIUM-DEXTROSE 2-4 GM/100ML-% IV SOLN
2.0000 g | INTRAVENOUS | Status: AC
  Administered 2024-07-12: 2 g via INTRAVENOUS
  Filled 2024-07-12: qty 100

## 2024-07-12 MED ORDER — FENTANYL CITRATE PF 50 MCG/ML IJ SOSY: 25.0000 ug | PREFILLED_SYRINGE | INTRAMUSCULAR | Status: DC | PRN

## 2024-07-12 MED ORDER — ONDANSETRON HCL 4 MG/2ML IJ SOLN
INTRAMUSCULAR | Status: DC | PRN
  Administered 2024-07-12: 4 mg via INTRAVENOUS

## 2024-07-12 MED ORDER — LACTATED RINGERS IV SOLN: INTRAVENOUS | Status: DC

## 2024-07-12 SURGICAL SUPPLY — 29 items
BAG COUNTER SPONGE SURGICOUNT (BAG) IMPLANT
BLADE SURG 15 STRL LF DISP TIS (BLADE) ×1 IMPLANT
BNDG GAUZE DERMACEA FLUFF 4 (GAUZE/BANDAGES/DRESSINGS) ×1 IMPLANT
COVER SURGICAL LIGHT HANDLE (MISCELLANEOUS) ×1 IMPLANT
DRAPE LAPAROTOMY T 98X78 PEDS (DRAPES) ×1 IMPLANT
DRAPE UTILITY XL STRL (DRAPES) ×1 IMPLANT
ELECT PENCIL ROCKER SW 15FT (MISCELLANEOUS) ×1 IMPLANT
ELECT REM PT RETURN 15FT ADLT (MISCELLANEOUS) ×1 IMPLANT
GAUZE 4X4 16PLY ~~LOC~~+RFID DBL (SPONGE) ×1 IMPLANT
GLOVE SURG LX STRL 7.5 STRW (GLOVE) ×1 IMPLANT
GOWN STRL REUS W/ TWL XL LVL3 (GOWN DISPOSABLE) ×1 IMPLANT
KIT BASIN OR (CUSTOM PROCEDURE TRAY) ×1 IMPLANT
KIT TURNOVER KIT A (KITS) ×1 IMPLANT
NDL HYPO 22X1.5 SAFETY MO (MISCELLANEOUS) IMPLANT
NEEDLE HYPO 22X1.5 SAFETY MO (MISCELLANEOUS) ×1 IMPLANT
NS IRRIG 1000ML POUR BTL (IV SOLUTION) ×1 IMPLANT
PACK BASIC VI WITH GOWN DISP (CUSTOM PROCEDURE TRAY) ×1 IMPLANT
PAD TELFA 2X3 NADH STRL (GAUZE/BANDAGES/DRESSINGS) IMPLANT
SOL PREP POV-IOD 4OZ 10% (MISCELLANEOUS) ×1 IMPLANT
SUPPORTER AHLETIC TETRA LG (SOFTGOODS) ×1 IMPLANT
SUT CHROMIC 3 0 SH 27 (SUTURE) ×1 IMPLANT
SUT CHROMIC 4 0 RB 1X27 (SUTURE) IMPLANT
SUT VIC AB 2-0 SH 27X BRD (SUTURE) IMPLANT
SUT VIC AB 2-0 UR5 27 (SUTURE) IMPLANT
SUT VICRYL 0 TIES 12 18 (SUTURE) IMPLANT
SYR CONTROL 10ML LL (SYRINGE) ×1 IMPLANT
TOWEL OR 17X26 10 PK STRL BLUE (TOWEL DISPOSABLE) ×1 IMPLANT
WATER STERILE IRR 1000ML POUR (IV SOLUTION) ×1 IMPLANT
YANKAUER SUCT BULB TIP 10FT TU (MISCELLANEOUS) ×1 IMPLANT

## 2024-07-12 NOTE — Op Note (Addendum)
 Preoperative Diagnosis: Right Hydrocele  Postoperative Diagnosis:  Right Hydrocele  Procedure(s) Performed:  1. Right hydrocelectomy  Teaching Surgeon:  Donnice Brooks, MD  Resident Surgeon:  Lyle GEANNIE Civil, MD  Anesthesia:  General   Fluids:  See anesthesia record  Estimated blood loss:  minimal   Specimens:  None  Cultures:  None  Drains:  None  Complications:  None  Indications: This is a 48 y.o. patient with history of right hydrocele. After discussion of the risks & benefits and alternatives to surgical approach, the patient wishes to proceed with right hydrocelectomy.    Findings:   -300ccs of serous fluid drained from right hydrocele sac.  -Excess edges of hydrocele sac excised circumferentially and Jaboulay's technique used to oversew the sac.   -right testicle and epididymis palpably normal without mass  Description:  The patient was correctly identified in the preop holding area where written informed consent as well potential risk and complication reviewed. The patient agreed and was brought back to the operative suite where a preinduction timeout was performed. Once correct information was verified, general anesthesia was induced. The patient was then gently placed into supine position with SCDs in place for VTE prophylaxis. They were prepped and draped in the usual sterile fashion and given appropriate preoperative antibiotics. A second timeout was then performed.   A 5cm right scrotal incision was made after infilatration with 0.25 marcaine  plain. Using a combination of blunt dissection and Bovie electrocautery we dissected through the dartos layer until the right hydrocele and testicle were delivered. The remainder of the surrounding fascia was bluntly dissected off of the hydrocele sac. The hydrocele sac was opened sharply away from the testicle with drainage of approximately 300cc fluid. The sac was then opened more proximally near the spermatic cord. Redundant  hydrocele sac was excised taking care to stay off of the spermatic cord and epididymis. The hydrocele sac edges were fulgurated for hemostasis. The remaining hydrocele sac was everted over the cord with running 2-0 vicryl using the Jabouley technique. The remainder of the surgical field was thoroughly inspected and meticulous hemostasis was attained. The surgical field was irrigated. The right testicle was replaced into the scrotum in the correct anatomic position. We elected not to leave a surgical drain due to excellent hemostasis. The dartos layer was closed with running 2-0 Vicryl. The scrotal skin was closed with running 3-0 Chromic in horizontal mattress fashion. I placed 5 more cc 0.25 plain marcaine  around the right spermatic cord for a cord block. The scrotum was dressed with fluff gauze and scrotal support. The patient was woken up from the procedure and taken to the recovery unit for routine postoperative care.

## 2024-07-12 NOTE — Anesthesia Postprocedure Evaluation (Signed)
 Anesthesia Post Note  Patient: Geoffrey Hardy  Procedure(s) Performed: HYDROCELECTOMY (Right)     Patient location during evaluation: PACU Anesthesia Type: General Level of consciousness: awake and alert Pain management: pain level controlled Vital Signs Assessment: post-procedure vital signs reviewed and stable Respiratory status: spontaneous breathing, nonlabored ventilation, respiratory function stable and patient connected to nasal cannula oxygen Cardiovascular status: blood pressure returned to baseline and stable Postop Assessment: no apparent nausea or vomiting Anesthetic complications: no   No notable events documented.  Last Vitals:  Vitals:   07/12/24 0905 07/12/24 0927  BP: (!) 129/96 (!) 129/101  Pulse: 64 65  Resp: 18 18  Temp: 36.7 C 36.8 C  SpO2: 95% 100%    Last Pain:  Vitals:   07/12/24 0927  TempSrc:   PainSc: 0-No pain                 [name]

## 2024-07-12 NOTE — Anesthesia Preprocedure Evaluation (Addendum)
Anesthesia Evaluation  Patient identified by MRN, date of birth, ID band Patient awake    Reviewed: Allergy & Precautions, NPO status , Patient's Chart, lab work & pertinent test results  Airway Mallampati: I  TM Distance: >3 FB Neck ROM: Full    Dental  (+) Teeth Intact, Dental Advisory Given   Pulmonary neg pulmonary ROS   breath sounds clear to auscultation       Cardiovascular negative cardio ROS  Rhythm:Regular Rate:Normal     Neuro/Psych negative neurological ROS  negative psych ROS   GI/Hepatic negative GI ROS, Neg liver ROS,,,  Endo/Other  negative endocrine ROS    Renal/GU negative Renal ROS     Musculoskeletal negative musculoskeletal ROS (+)    Abdominal   Peds  Hematology negative hematology ROS (+)   Anesthesia Other Findings   Reproductive/Obstetrics                             Anesthesia Physical Anesthesia Plan  ASA: 2  Anesthesia Plan: General   Post-op Pain Management: Tylenol PO (pre-op)* and Toradol IV (intra-op)*   Induction: Intravenous  PONV Risk Score and Plan: 3 and Ondansetron, Dexamethasone and Midazolam  Airway Management Planned: LMA  Additional Equipment: None  Intra-op Plan:   Post-operative Plan: Extubation in OR  Informed Consent: I have reviewed the patients History and Physical, chart, labs and discussed the procedure including the risks, benefits and alternatives for the proposed anesthesia with the patient or authorized representative who has indicated his/her understanding and acceptance.     Dental advisory given  Plan Discussed with: CRNA  Anesthesia Plan Comments:        Anesthesia Quick Evaluation

## 2024-07-12 NOTE — Anesthesia Procedure Notes (Signed)
 Procedure Name: LMA Insertion Date/Time: 07/12/2024 7:40 AM  Performed by: Emilio Rock DEL, CRNAPre-anesthesia Checklist: Patient identified, Emergency Drugs available, Suction available, Patient being monitored and Timeout performed Patient Re-evaluated:Patient Re-evaluated prior to induction Oxygen Delivery Method: Circle system utilized Preoxygenation: Pre-oxygenation with 100% oxygen Induction Type: IV induction Ventilation: Mask ventilation without difficulty LMA: LMA inserted LMA Size: 5.0 Number of attempts: 1 Tube secured with: Tape Dental Injury: Teeth and Oropharynx as per pre-operative assessment

## 2024-07-12 NOTE — Transfer of Care (Signed)
 Immediate Anesthesia Transfer of Care Note  Patient: Geoffrey Hardy  Procedure(s) Performed: HYDROCELECTOMY (Right)  Patient Location: PACU  Anesthesia Type:General  Level of Consciousness: awake, drowsy, and patient cooperative  Airway & Oxygen Therapy: Patient Spontanous Breathing and Patient connected to face mask oxygen  Post-op Assessment: Report given to RN and Post -op Vital signs reviewed and stable  Post vital signs: stable  Last Vitals:  Vitals Value Taken Time  BP 141/94 07/12/24 08:33  Temp    Pulse 68 07/12/24 08:35  Resp 15 07/12/24 08:35  SpO2 100 % 07/12/24 08:35  Vitals shown include unfiled device data.  Last Pain:  Vitals:   07/12/24 0639  TempSrc:   PainSc: 0-No pain         Complications: No notable events documented.

## 2024-07-12 NOTE — H&P (Signed)
 H&P  Chief Complaint: right hydrocele  History of Present Illness: 48 yo male that noted a tick bite May 2025 in the groin/testicle area.  He noticed swelling of the right hemiscrotum.  March 2025 creatinine 1.14.  UA clear.  A 02/24/2024 scrotal ultrasound report from [facility]  reported a large hydrocele seen on the right side.  Testicles reported as no mass. No prior inguinal or scrotal surgery. IPSS 0.    Today, Geoffrey Hardy is seen for the above.    He works for [employer] in [place].   History reviewed. No pertinent past medical history. Past Surgical History:  Procedure Laterality Date   COLONOSCOPY W/ POLYPECTOMY     SURGERY OF LIP  1994   skin graft to lower inside of lip   WISDOM TOOTH EXTRACTION  1990    Home Medications:  Medications Prior to Admission  Medication Sig Dispense Refill Last Dose/Taking   ibuprofen (ADVIL) 200 MG tablet Take 600 mg by mouth daily as needed (severe headaches.).   Past Week   levocetirizine (XYZAL) 5 MG tablet Take 5 mg by mouth in the morning.   07/12/2024 at  4:00 AM   montelukast (SINGULAIR) 10 MG tablet Take 10 mg by mouth daily in the afternoon.   07/11/2024   Allergies:  Allergies  Allergen Reactions   Erythromycin     UPSET STOMACH    Penicillins     UPSET STOMACH     History reviewed. No pertinent family history. Social History:  reports that he has never smoked. He quit smokeless tobacco use about 30 years ago.  His smokeless tobacco use included snuff. He reports current alcohol use. He reports that he does not use drugs.  ROS: A complete review of systems was performed.  All systems are negative except for pertinent findings as noted. Review of Systems  All other systems reviewed and are negative.    Physical Exam:  Vital signs in last 24 hours: Temp:  [98 F (36.7 C)] 98 F (36.7 C) (09/23 0619) Pulse Rate:  [69-70] 69 (09/23 0639) Resp:  [15-16] 16 (09/23 0639) BP: (142-150)/(101-106) 150/101 (09/23  0639) SpO2:  [95 %] 95 % (09/23 0619) Weight:  [100.2 kg] 100.2 kg (09/23 9366) General:  Alert and oriented, No acute distress HEENT: Normocephalic, atraumatic Cardiovascular: Regular rate and rhythm Lungs: Regular rate and effort Abdomen: Soft, nontender, nondistended, no abdominal masses Back: No CVA tenderness Extremities: No edema Neurologic: Grossly intact GU: large right scrotal swelling 8 cm + c/w right hydrocele. Left testicle palpably nl. Scrotum nl.   Laboratory Data:  No results found for this or any previous visit (from the past 24 hours). No results found for this or any previous visit (from the past 240 hours). Creatinine: No results for input(s): CREATININE in the last 168 hours.  Impression/Assessment/plan:  I discussed with the patient and his wife the nature, potential benefits, risks and alternatives to right hydrocelectomy, including side effects of the proposed treatment, the likelihood of the patient achieving the goals of the procedure, and any potential problems that might occur during the procedure or recuperation. All questions answered. Patient elects to proceed.   Donnice Brooks 07/12/2024, 7:21 AM

## 2024-07-13 ENCOUNTER — Encounter (HOSPITAL_COMMUNITY): Payer: Self-pay | Admitting: Urology
